# Patient Record
Sex: Male | Born: 1995 | Hispanic: Yes | Marital: Single | Smoking: Never smoker
Health system: Southern US, Community
[De-identification: ages and names within clinical notes are randomized; demographics above are authoritative.]

---
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
## 2013-12-20 IMAGING — US US RENAL
1 series · 14 of 25 positions shown · non-contrast
Comparison: None.

CLINICAL DATA: 15-year-old with hematuria.

RENAL/URINARY TRACT ULTRASOUND COMPLETE

[Series 1: us renal · 0.31mm/px · 14 of 31 slices shown]
[im 1/31]
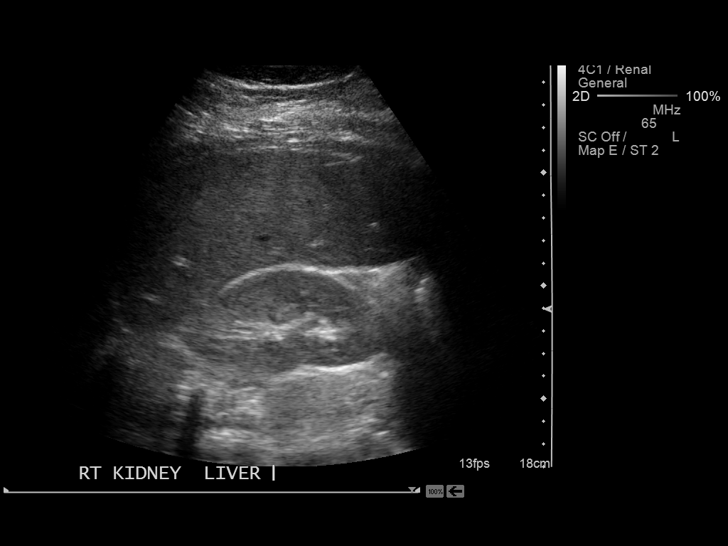
[im 3/31]
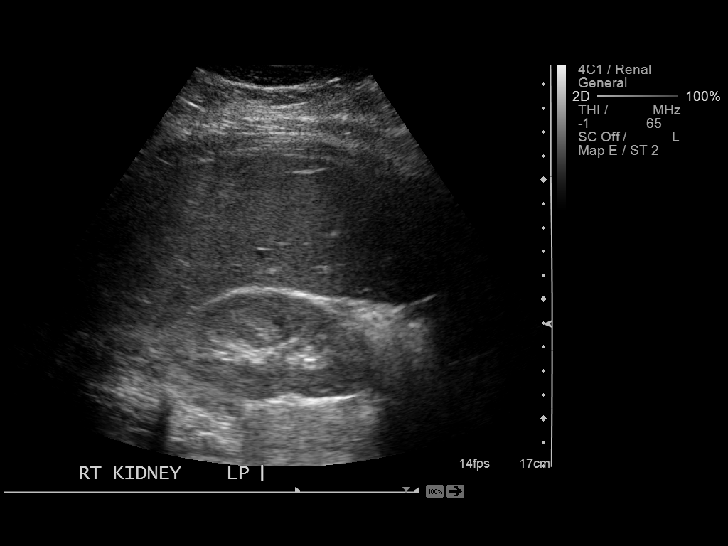
[im 6/31]
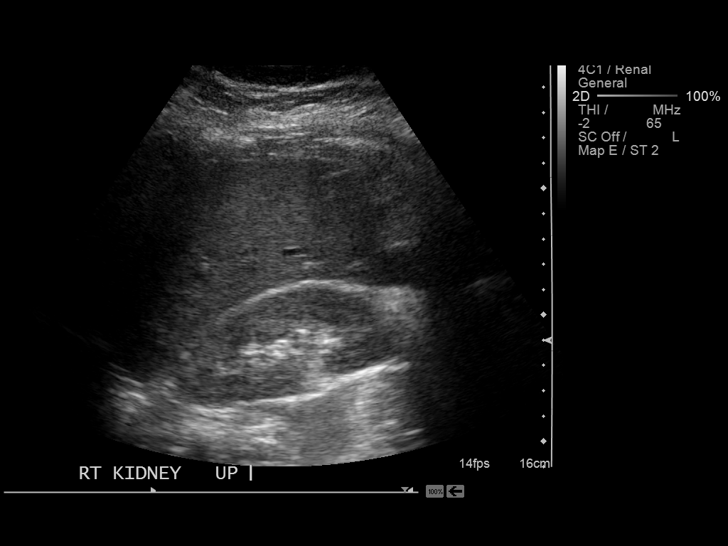
[im 8/31]
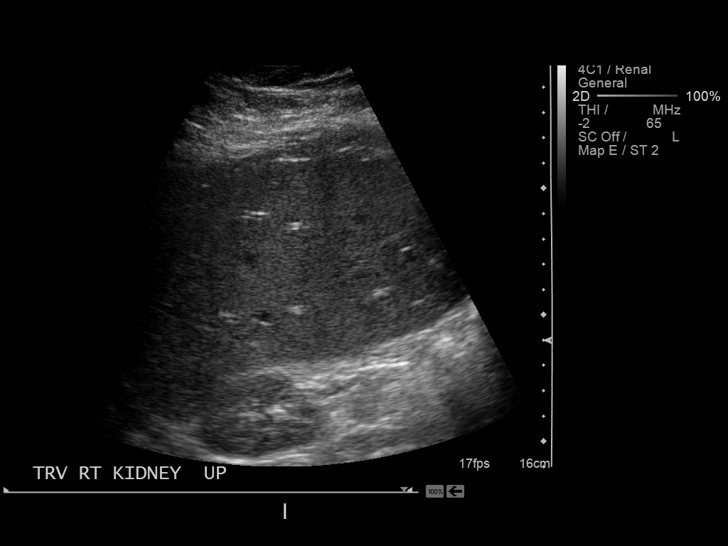
[im 11/31]
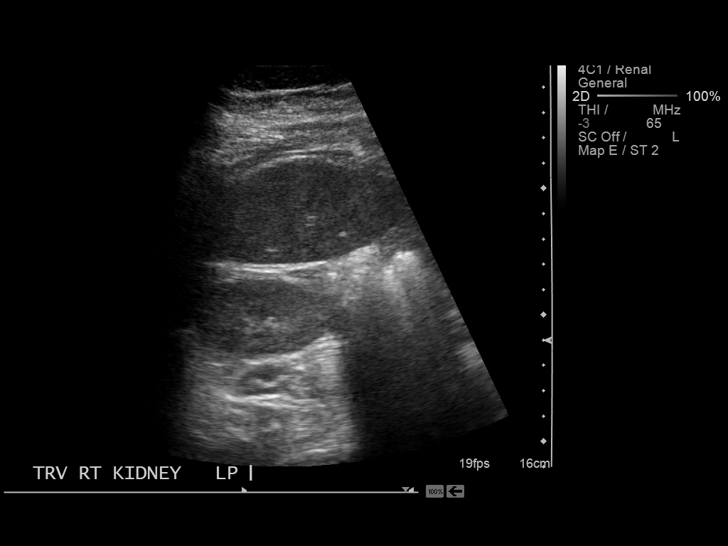
[im 12/31]
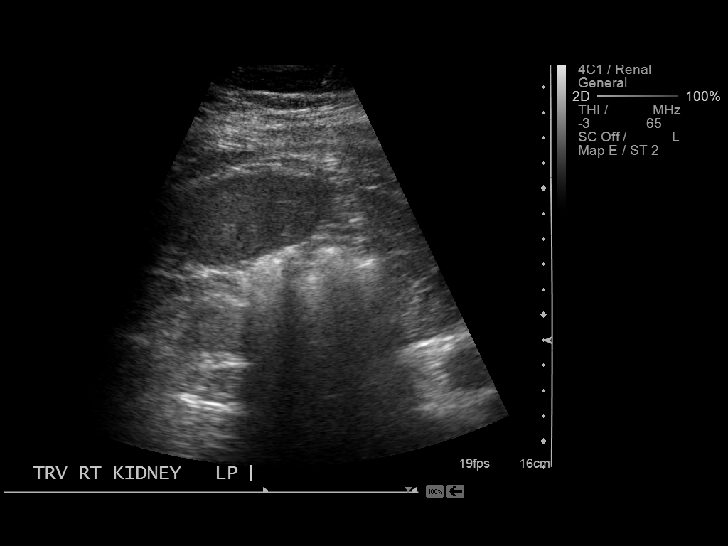
[im 14/31]
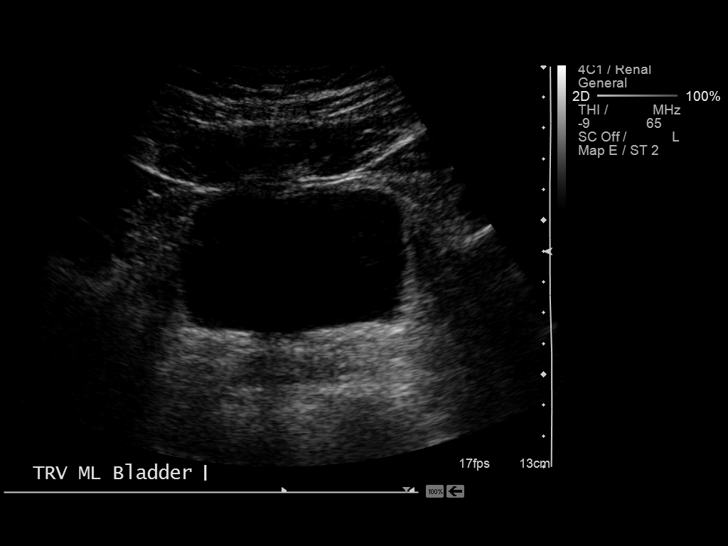
[im 17/31]
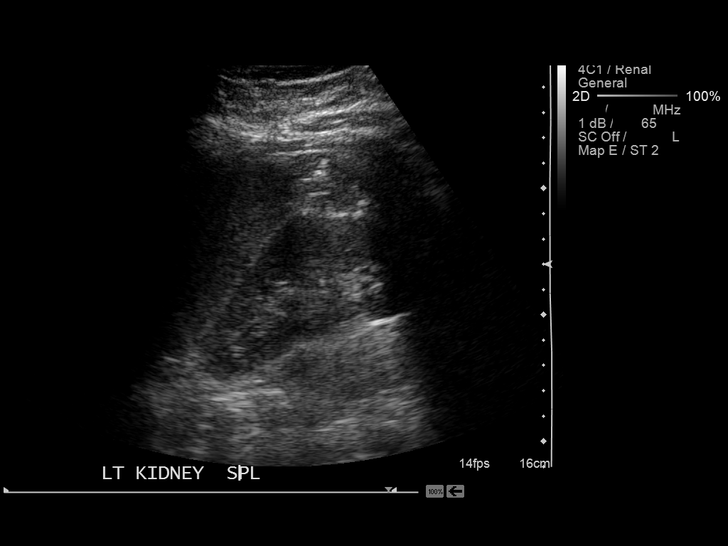
[im 19/31]
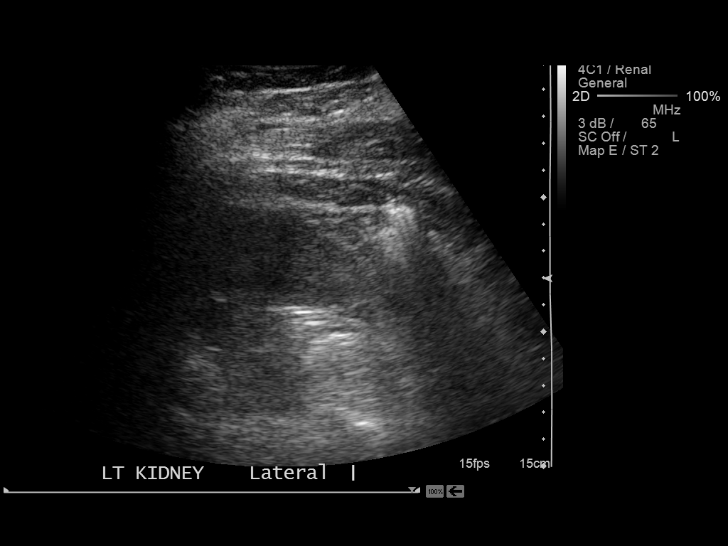
[im 21/31]
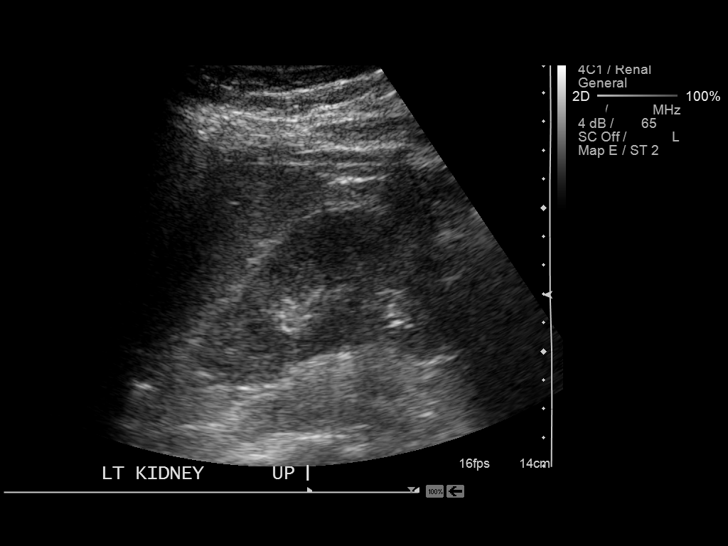
[im 23/31]
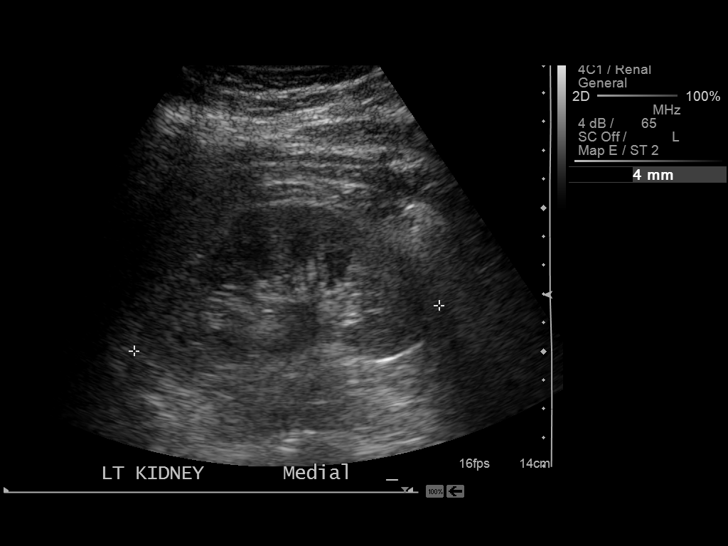
[im 26/31]
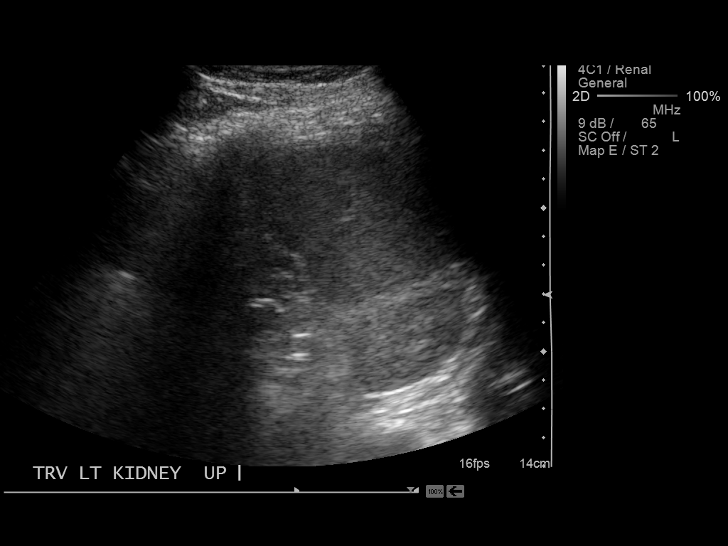
[im 28/31]
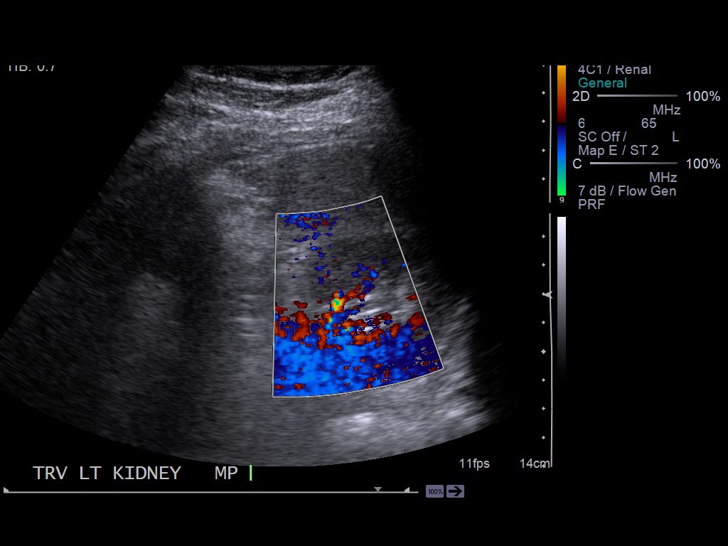
[im 31/31]
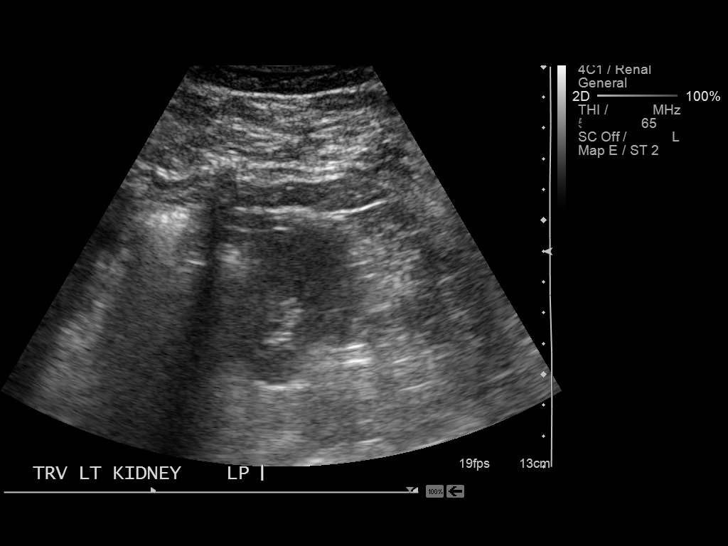

[14 of 25 positions shown; findings below may reference images not displayed]

FINDINGS: Right Kidney:  No hydronephrosis.  Well-preserved cortex.  No
shadowing calculi.  Normal size and parenchymal echotexture without
focal abnormalities.  Approximately 10.4 cm in length.

Left Kidney:  No hydronephrosis.  Well-preserved cortex.  No
shadowing calculi.  Normal size and parenchymal echotexture without
focal abnormalities.  Approximately 10.7 cm in length.

Bladder:  Decompressed and normal in appearance.
IMPRESSION: Normal urinary tract ultrasound.

## 2018-11-07 ENCOUNTER — Emergency Department (HOSPITAL_COMMUNITY): Payer: Self-pay

## 2018-11-07 ENCOUNTER — Encounter (HOSPITAL_COMMUNITY): Payer: Self-pay | Admitting: Emergency Medicine

## 2018-11-07 ENCOUNTER — Other Ambulatory Visit: Payer: Self-pay

## 2018-11-07 ENCOUNTER — Emergency Department (HOSPITAL_COMMUNITY)
Admission: EM | Admit: 2018-11-07 | Discharge: 2018-11-07 | Disposition: A | Discharge status: Home / Self Care | Payer: Self-pay | Attending: Emergency Medicine | Admitting: Emergency Medicine

## 2018-11-07 DIAGNOSIS — B9789 Other viral agents as the cause of diseases classified elsewhere: Secondary | ICD-10-CM | POA: Insufficient documentation

## 2018-11-07 DIAGNOSIS — J208 Acute bronchitis due to other specified organisms: Secondary | ICD-10-CM | POA: Insufficient documentation

## 2018-11-07 LAB — GROUP A STREP BY PCR: GROUP A STREP BY PCR: NOT DETECTED

## 2018-11-07 MED ORDER — ALBUTEROL SULFATE (2.5 MG/3ML) 0.083% IN NEBU
2.5000 mg | INHALATION_SOLUTION | Freq: Once | RESPIRATORY_TRACT | Status: AC
  Administered 2018-11-07: 2.5 mg via RESPIRATORY_TRACT
  Filled 2018-11-07: qty 3

## 2018-11-07 MED ORDER — ALBUTEROL SULFATE HFA 108 (90 BASE) MCG/ACT IN AERS: 1.0000 | INHALATION_SPRAY | Freq: Four times a day (QID) | RESPIRATORY_TRACT | 0 refills | Status: AC | PRN

## 2018-11-07 NOTE — ED Triage Notes (Signed)
Fever x 3 days and then it stopped And today his throat hurts

## 2018-11-07 NOTE — ED Provider Notes (Signed)
[facility] Provider Note   CSN: [id number] Arrival date & time: 11/07/18  1358     History   Chief Complaint No chief complaint on file.   HPI Stuart Wu is a 23 y.o. male.  The history is provided by the patient. No language interpreter was used.   Stuart Wu is a 23 y.o. male who presents to the Emergency Department complaining of fever, sore throat. He presents to the emergency department complaining of cough, fever and sore throat. Symptoms began about four days ago with sneezing, coughing. Shortly thereafter he developed subjective fevers at home, last fever was yesterday. Yesterday he developed a sore throat. Overall his coughing and sneezing is improving. No nausea, vomiting, abdominal pain, diarrhea. Symptoms are mild to moderate and constant nature. History reviewed. No pertinent past medical history.  Patient Active Problem List   Diagnosis Date Noted  . Cellulitis 05/31/2012  . Obesity 05/31/2012  . Acanthosis nigricans 05/31/2012  . Hematuria 05/31/2012    History reviewed. No pertinent surgical history.      Home Medications    Prior to Admission medications   Medication Sig Start Date End Date Taking? Authorizing Provider  albuterol (PROVENTIL HFA;VENTOLIN HFA) 108 (90 Base) MCG/ACT inhaler Inhale 1-2 puffs into the lungs every 6 (six) hours as needed for wheezing or shortness of breath. 11/07/18   Tilden Fossaees, Elyas Villamor, MD    Family History No family history on file.  Social History Social History   Tobacco Use  . Smoking status: Never Smoker  . Smokeless tobacco: Never Used  Substance Use Topics  . Alcohol use: Not on file  . Drug use: Not on file     Allergies   Patient has no known allergies.   Review of Systems Review of Systems  All other systems reviewed and are negative.    Physical Exam Updated Vital Signs BP 140/79   Pulse 82   Temp 98.9 F (37.2 C)   Resp 16   SpO2 98%    Physical Exam Vitals signs and nursing note reviewed.  Constitutional:      Appearance: He is well-developed.  HENT:     Head: Normocephalic and atraumatic.     Mouth/Throat:     Comments: Mild erythema and edema in the posterior oropharynx without any exudates Cardiovascular:     Rate and Rhythm: Normal rate and regular rhythm.     Heart sounds: No murmur.  Pulmonary:     Effort: Pulmonary effort is normal. No respiratory distress.     Comments: Occasional wheezes bilaterally Abdominal:     Palpations: Abdomen is soft.     Tenderness: There is no abdominal tenderness. There is no guarding or rebound.  Musculoskeletal:        General: No tenderness.  Skin:    General: Skin is warm and dry.  Neurological:     Mental Status: He is alert and oriented to person, place, and time.  Psychiatric:        Behavior: Behavior normal.      ED Treatments / Results  Labs (all labs ordered are listed, but only abnormal results are displayed) Labs Reviewed  GROUP A STREP BY PCR    EKG None  Radiology Dg Chest 2 View  Result Date: 11/07/2018 CLINICAL DATA:  Shortness of breath, fever, cough and pharyngitis for 1 week. EXAM: CHEST - 2 VIEW COMPARISON:  None. FINDINGS: Normal sized heart. Clear lungs. Minimal peribronchial thickening. Unremarkable bones. IMPRESSION: Minimal  bronchitic changes. Electronically Signed   By: Beckie Salts M.D.   On: 11/07/2018 15:11    Procedures Procedures (including critical care time)  Medications Ordered in ED Medications  albuterol (PROVENTIL) (2.5 MG/3ML) 0.083% nebulizer solution 2.5 mg (2.5 mg Nebulization Given 11/07/18 1422)     Initial Impression / Assessment and Plan / ED Course  I have reviewed the triage vital signs and the nursing notes.  Pertinent labs & imaging results that were available during my care of the patient were reviewed by me and considered in my medical decision making (see chart for details).     Patient here for  evaluation of fever, cough, sore throat. He is non-toxic appearing on evaluation with no respiratory distress. Following albuterol treatment he is feeling improved, wheezing resolved on repeat assessment. No evidence of pneumonia. Presentation is not consistent with PTA, epiglotitis, RPA. Counseled patient on home care for viral URI with wheezing. Discussed outpatient follow-up and return precautions.  Final Clinical Impressions(s) / ED Diagnoses   Final diagnoses:  Acute viral bronchitis    ED Discharge Orders         Ordered    albuterol (PROVENTIL HFA;VENTOLIN HFA) 108 (90 Base) MCG/ACT inhaler  Every 6 hours PRN     11/07/18 1540           Tilden Fossa, MD 11/07/18 1541

## 2018-11-07 NOTE — ED Notes (Signed)
Pt transported to xray 

## 2020-05-26 IMAGING — DX DG CHEST 2V
2 series · 2 of 2 positions shown · non-contrast
Comparison: None.

CLINICAL DATA: Shortness of breath, fever, cough and pharyngitis
for 1 week.

EXAM:
CHEST - 2 VIEW

[chest pa]
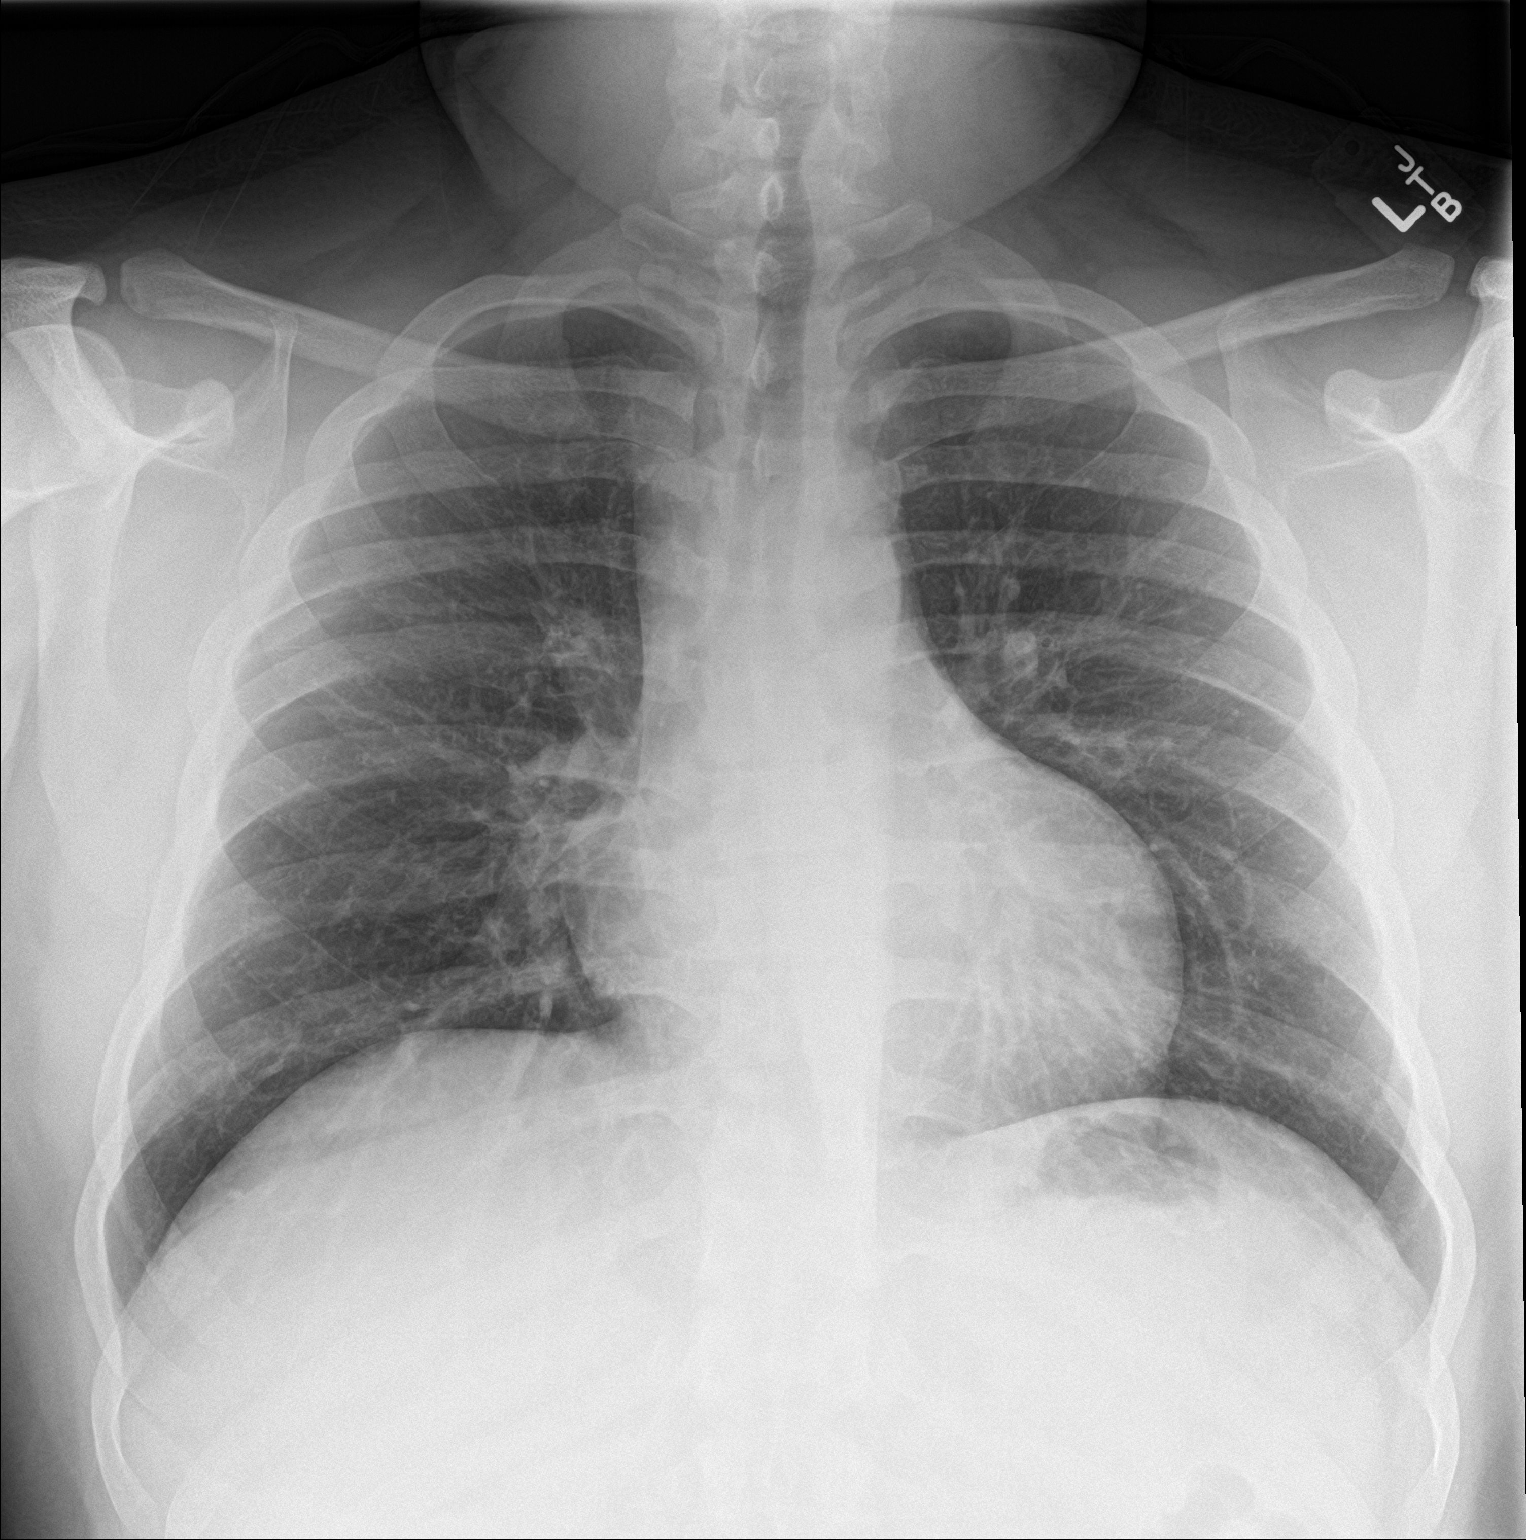

[chest lat]
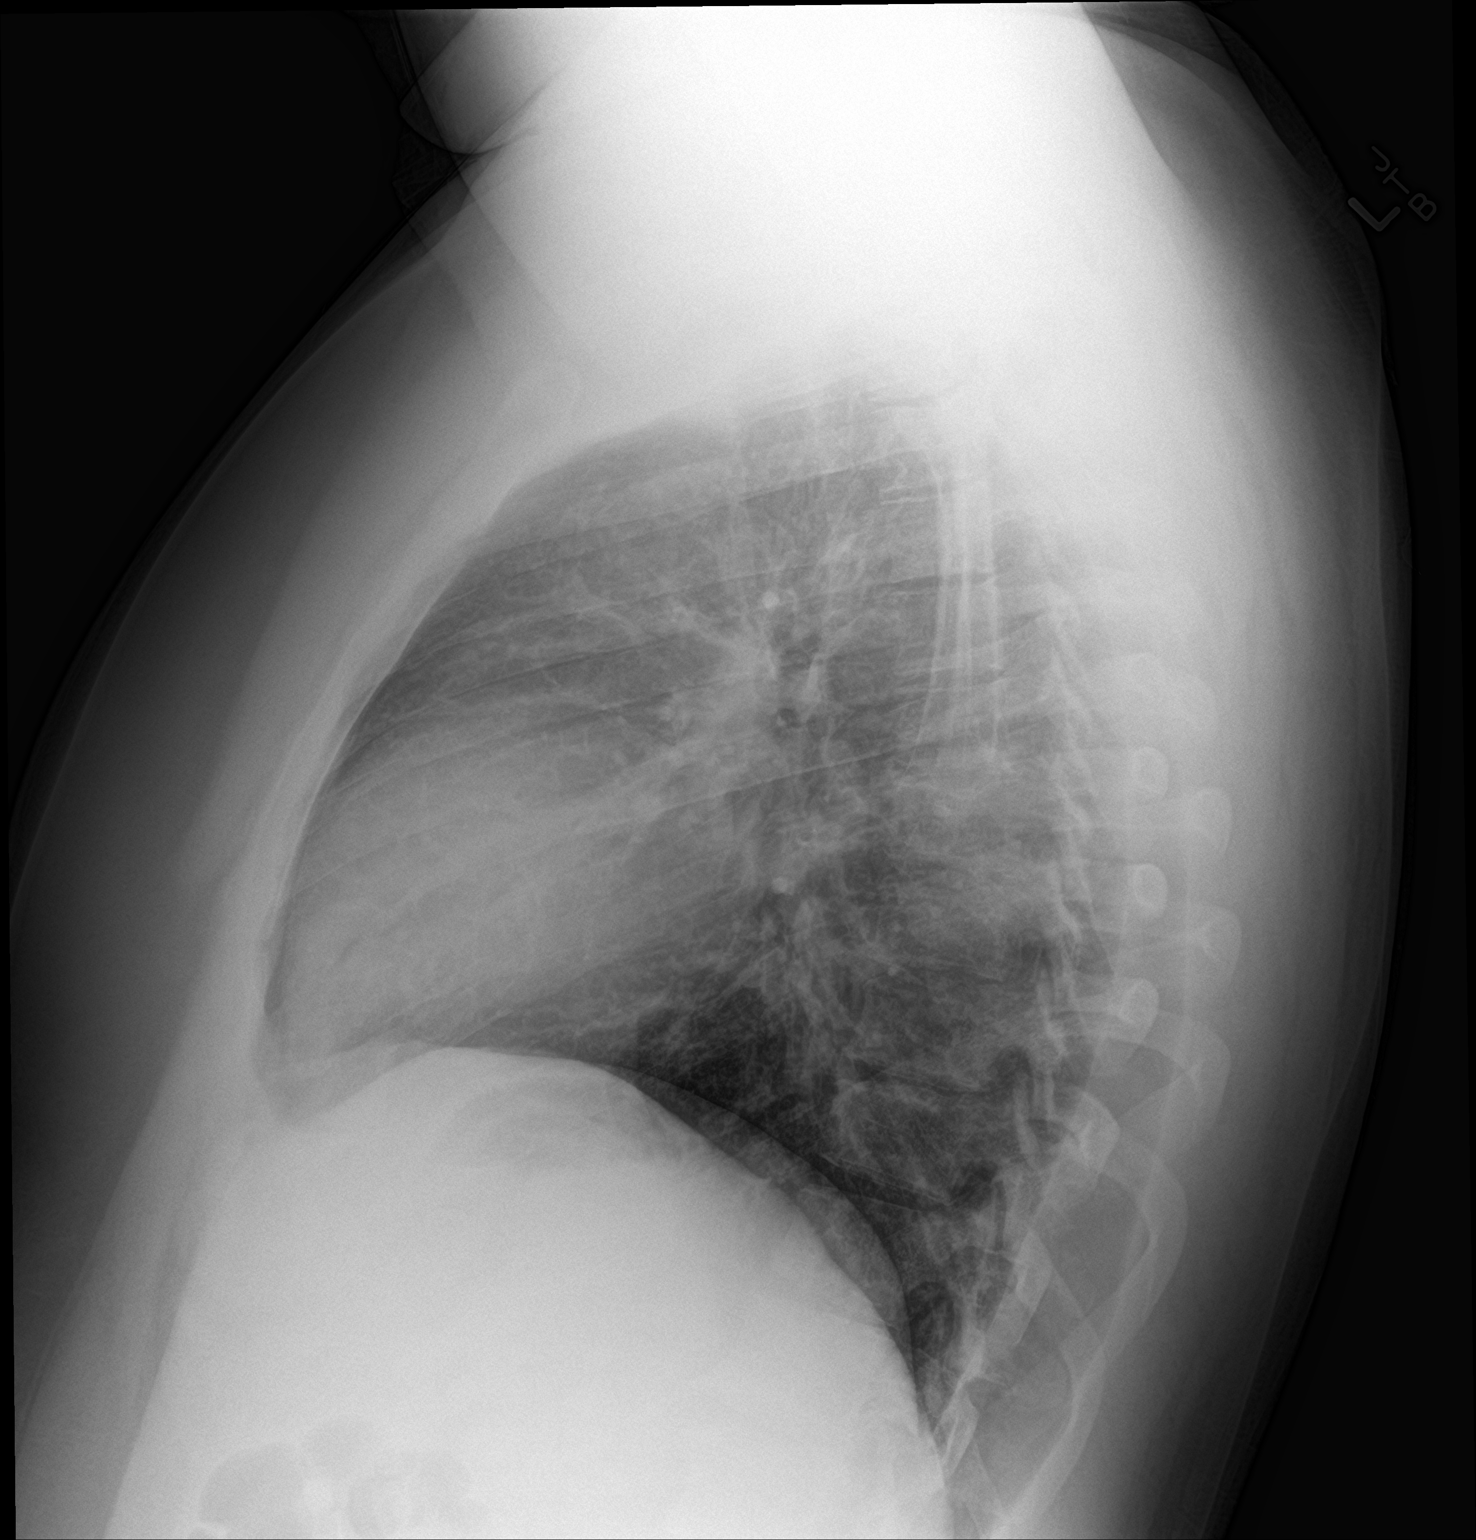

[2 of 2 positions shown; findings below may reference images not displayed]

FINDINGS: Normal sized heart. Clear lungs. Minimal peribronchial thickening.
Unremarkable bones.
IMPRESSION: Minimal bronchitic changes.
